# Patient Record
Sex: Male | Born: 1980 | Race: White | Hispanic: No | Marital: Single | Smoking: Former smoker
Health system: Southern US, Community
[De-identification: ages and names within clinical notes are randomized; demographics above are authoritative.]

---
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
## 2004-10-14 ENCOUNTER — Emergency Department (HOSPITAL_COMMUNITY): Admission: EM | Admit: 2004-10-14 | Discharge: 2004-10-14 | Payer: Self-pay | Admitting: Emergency Medicine

## 2005-03-27 IMAGING — CR DG HAND COMPLETE 3+V*R*
3 series · 3 of 3 positions shown · non-contrast
Comparison: none

CLINICAL DATA: Cut on glass table.  Laceration across posterior aspect metacarpals.  
RIGHT HAND ? THREE VIEWS 10/14/04:
No acute bony abnormality.  Extensive soft tissue laceration dorsally.  No definite evidence of a piece of glass in the soft tissues.  On the AP view there is a small, somewhat triangular shaped density which is faint and projecting just lateral to the mid shaft of the second metacarpal which potentially could represent a small sliver of glass however also the overlying bandage material may cause this appearance as well.

[view not recorded (1 of 3)]
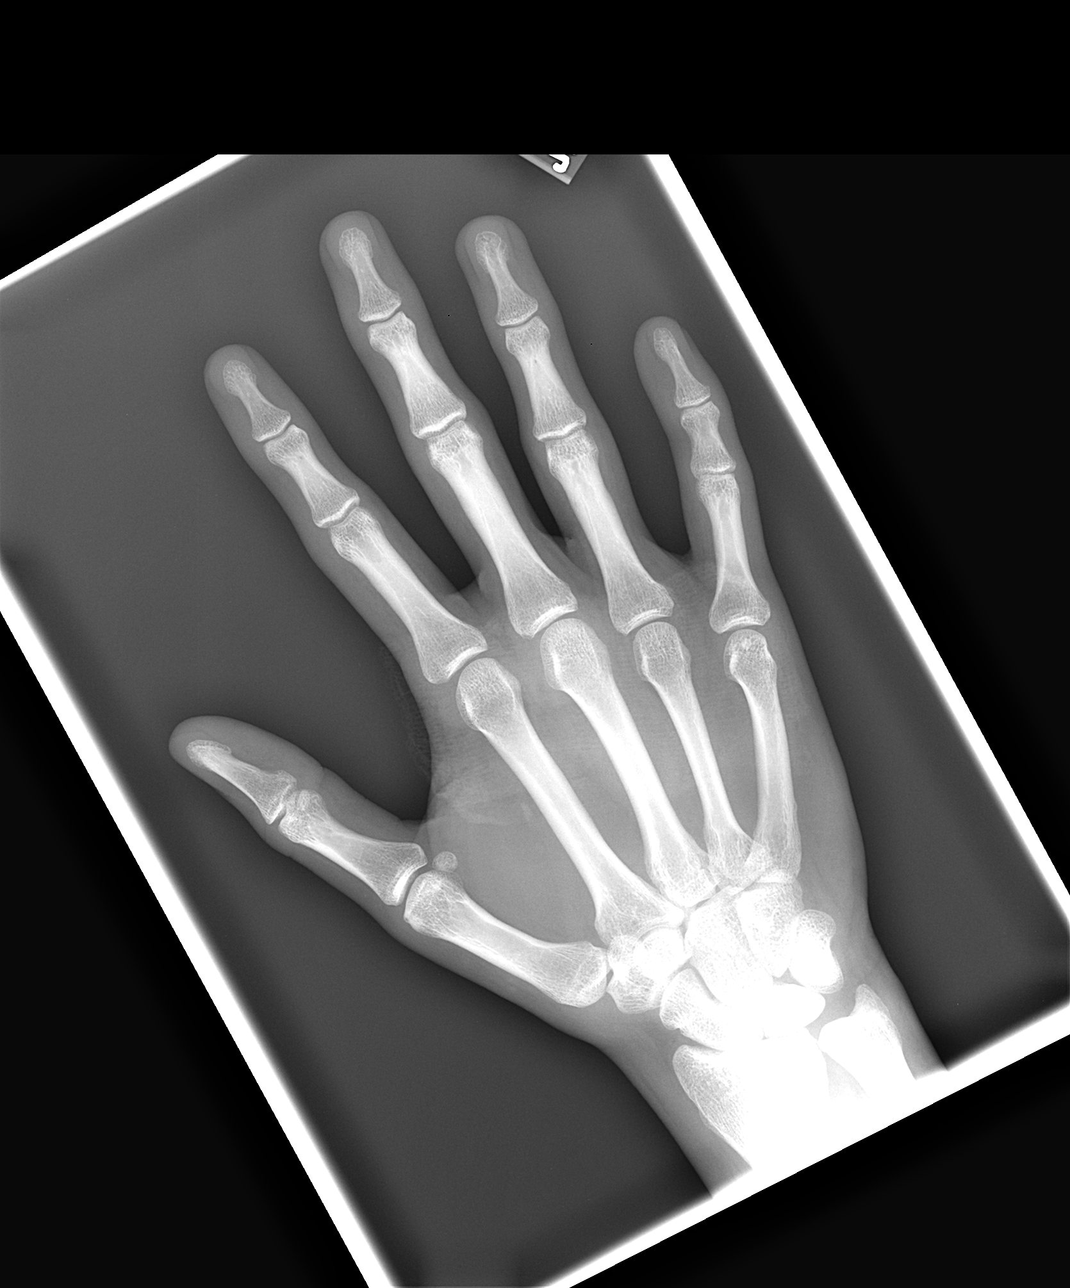

[view not recorded (2 of 3)]
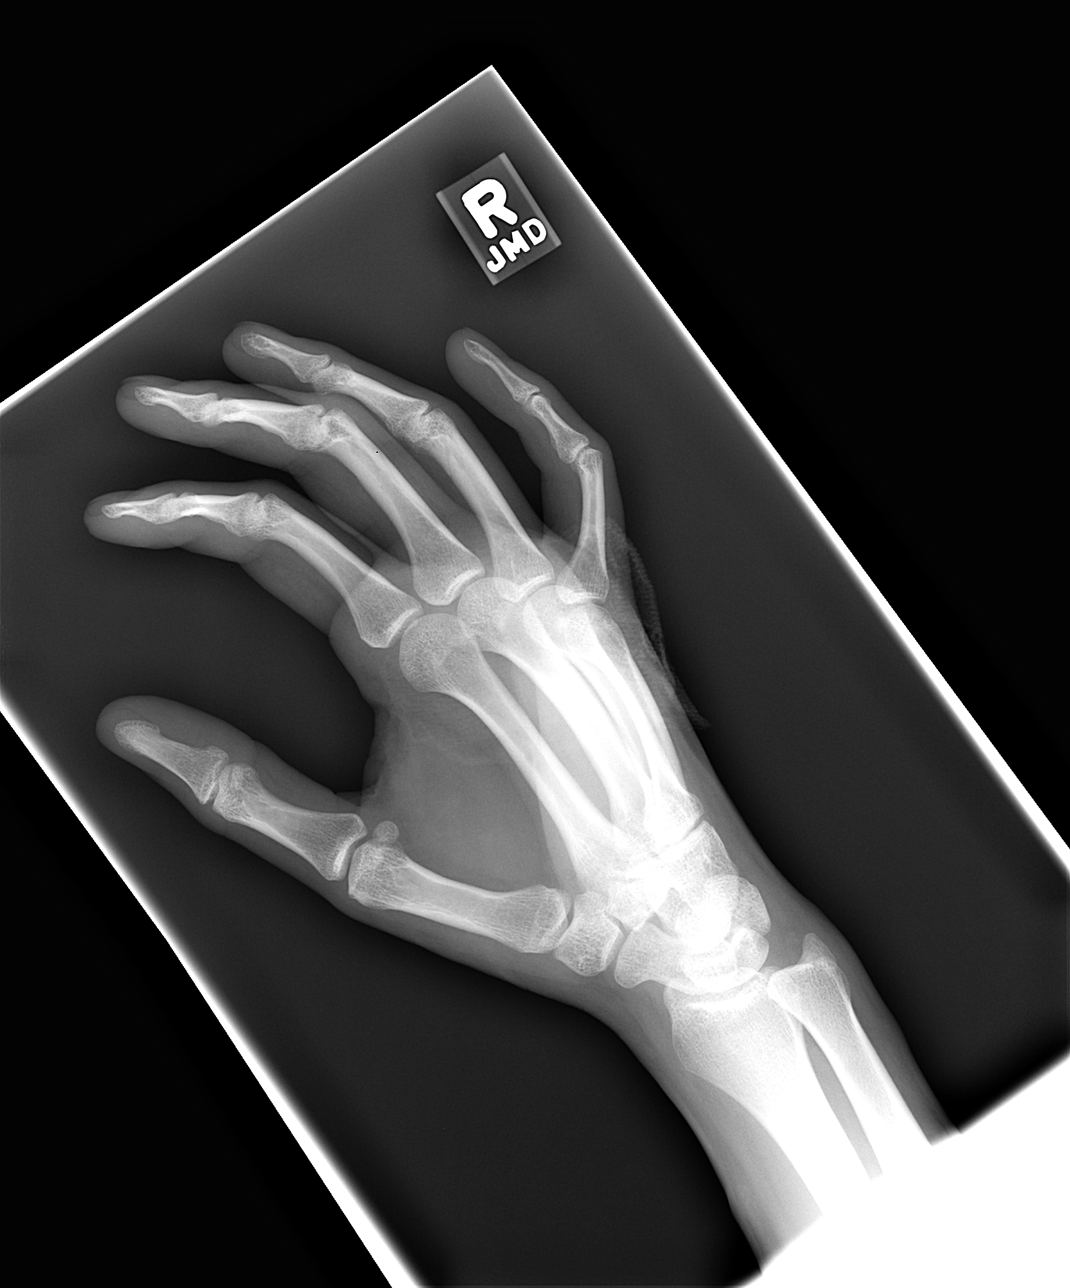

[view not recorded (3 of 3)]
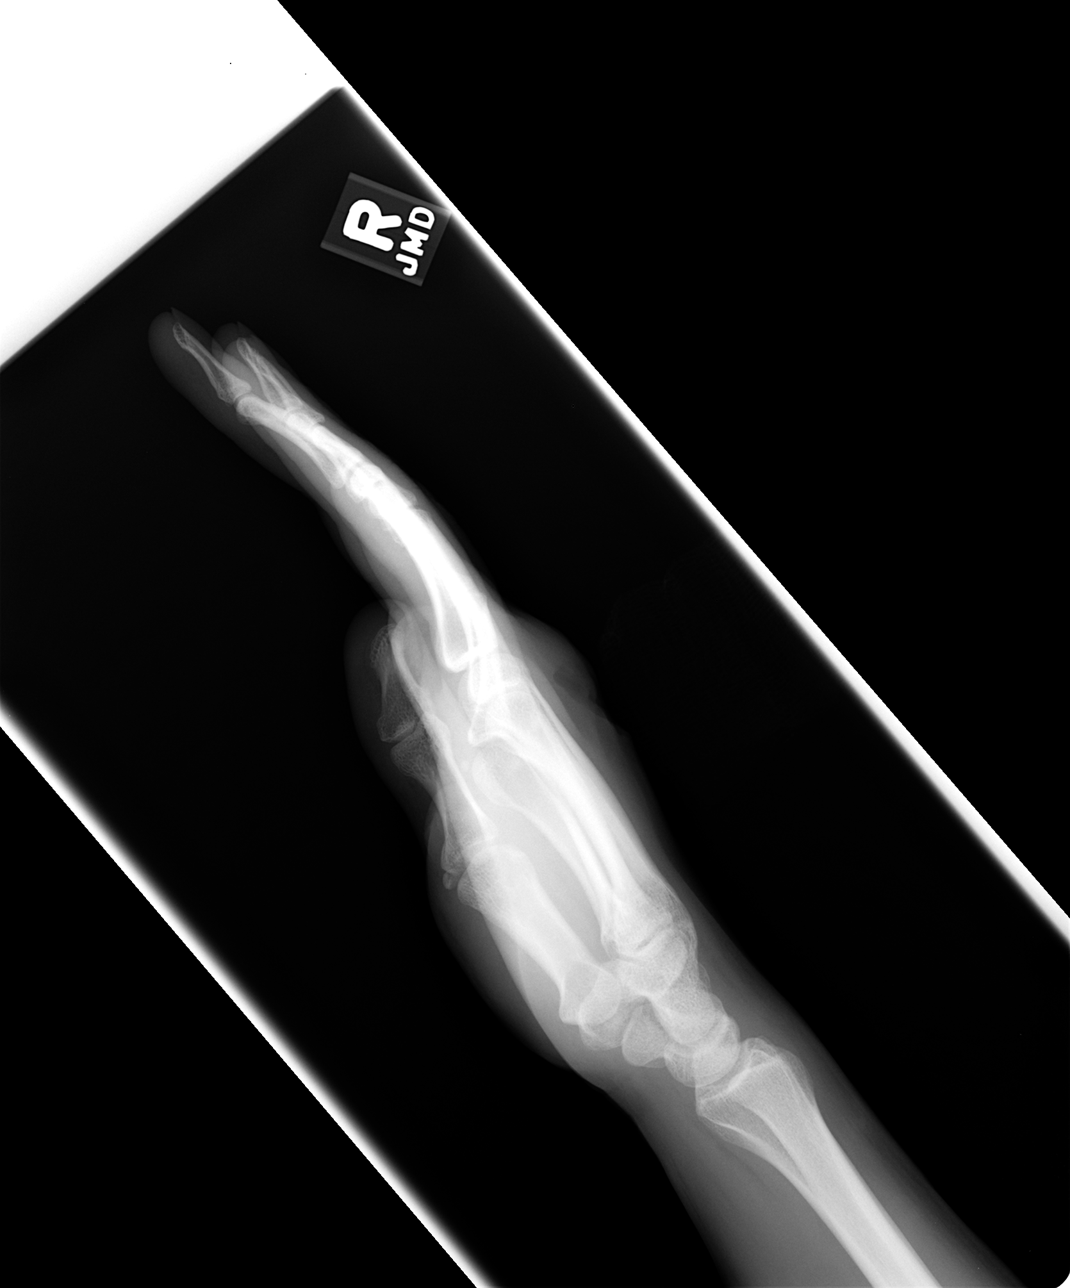

[3 of 3 positions shown; findings below may reference images not displayed]

IMPRESSION: No acute bony abnormality.  Extensive laceration.  No definite foreign body.

## 2010-11-06 ENCOUNTER — Emergency Department (HOSPITAL_COMMUNITY)
Admission: EM | Admit: 2010-11-06 | Discharge: 2010-11-06 | Payer: Self-pay | Source: Home / Self Care | Admitting: Emergency Medicine

## 2011-06-09 ENCOUNTER — Emergency Department (HOSPITAL_COMMUNITY)
Admission: EM | Admit: 2011-06-09 | Discharge: 2011-06-09 | Disposition: A | Discharge status: Home / Self Care | Payer: PRIVATE HEALTH INSURANCE | Attending: Emergency Medicine | Admitting: Emergency Medicine

## 2011-06-09 DIAGNOSIS — F411 Generalized anxiety disorder: Secondary | ICD-10-CM | POA: Insufficient documentation

## 2011-06-09 DIAGNOSIS — F1110 Opioid abuse, uncomplicated: Secondary | ICD-10-CM | POA: Insufficient documentation

## 2011-06-09 DIAGNOSIS — F1010 Alcohol abuse, uncomplicated: Secondary | ICD-10-CM | POA: Insufficient documentation

## 2011-06-09 DIAGNOSIS — R000 Tachycardia, unspecified: Secondary | ICD-10-CM | POA: Insufficient documentation

## 2011-06-09 LAB — URINALYSIS, ROUTINE W REFLEX MICROSCOPIC
Hgb urine dipstick: NEGATIVE
Protein, ur: 30 mg/dL — AB
Specific Gravity, Urine: 1.017 (ref 1.005–1.030)
Urobilinogen, UA: 0.2 mg/dL (ref 0.0–1.0)

## 2011-06-09 LAB — COMPREHENSIVE METABOLIC PANEL
ALT: 15 U/L (ref 0–53)
AST: 21 U/L (ref 0–37)
Alkaline Phosphatase: 86 U/L (ref 39–117)
BUN: 8 mg/dL (ref 6–23)
CO2: 25 mEq/L (ref 19–32)
Calcium: 8.6 mg/dL (ref 8.4–10.5)
Creatinine, Ser: 1.09 mg/dL (ref 0.50–1.35)
GFR calc Af Amer: >60 mL/min (ref >60)
GFR calc non Af Amer: >60 mL/min (ref >60)
Glucose, Bld: 86 mg/dL (ref 70–99)
Total Bilirubin: 0.1 mg/dL — ABNORMAL LOW (ref 0.3–1.2)

## 2011-06-09 LAB — RAPID URINE DRUG SCREEN, HOSP PERFORMED
Amphetamines: NOT DETECTED
Opiates: POSITIVE — AB
Tetrahydrocannabinol: NOT DETECTED

## 2011-06-09 LAB — CBC
HCT: 40.5 % (ref 39.0–52.0)
Hemoglobin: 13.5 g/dL (ref 13.0–17.0)
MCH: 30.3 pg (ref 26.0–34.0)
MCHC: 33.3 g/dL (ref 30.0–36.0)
MCV: 91 fL (ref 78.0–100.0)
Platelets: 261 10*3/uL (ref 150–400)
RBC: 4.45 MIL/uL (ref 4.22–5.81)
RDW: 13 % (ref 11.5–15.5)
WBC: 20.4 10*3/uL — ABNORMAL HIGH (ref 4.0–10.5)

## 2011-06-09 LAB — DIFFERENTIAL
Basophils Relative: 0 % (ref 0–1)
Eosinophils Absolute: 0 10*3/uL (ref 0.0–0.7)
Eosinophils Relative: 0 % (ref 0–5)
Lymphocytes Relative: 7 % — ABNORMAL LOW (ref 12–46)
Lymphs Abs: 1.3 10*3/uL (ref 0.7–4.0)
Monocytes Absolute: 1.2 10*3/uL — ABNORMAL HIGH (ref 0.1–1.0)
Monocytes Relative: 6 % (ref 3–12)
Neutro Abs: 17.8 10*3/uL — ABNORMAL HIGH (ref 1.7–7.7)

## 2011-06-09 LAB — URINE MICROSCOPIC-ADD ON

## 2011-06-09 LAB — ETHANOL: Alcohol, Ethyl (B): 149 mg/dL — ABNORMAL HIGH (ref 0–11)

## 2012-04-24 ENCOUNTER — Encounter: Payer: Self-pay | Admitting: Family

## 2012-04-24 ENCOUNTER — Ambulatory Visit: Payer: PRIVATE HEALTH INSURANCE | Admitting: Family

## 2012-04-24 ENCOUNTER — Ambulatory Visit (INDEPENDENT_AMBULATORY_CARE_PROVIDER_SITE_OTHER): Payer: PRIVATE HEALTH INSURANCE | Admitting: Family

## 2012-04-24 VITALS — BP 108/70 | Ht 74.0 in | Wt 202.0 lb

## 2012-04-24 DIAGNOSIS — L0100 Impetigo, unspecified: Secondary | ICD-10-CM

## 2012-04-24 DIAGNOSIS — K4090 Unilateral inguinal hernia, without obstruction or gangrene, not specified as recurrent: Secondary | ICD-10-CM

## 2012-04-24 LAB — POCT URINALYSIS DIPSTICK
Blood, UA: NEGATIVE
Ketones, UA: NEGATIVE
Protein, UA: NEGATIVE
Spec Grav, UA: 1.01
Urobilinogen, UA: 0.2
pH, UA: 6

## 2012-04-24 MED ORDER — HYDROCODONE-ACETAMINOPHEN 5-500 MG PO TABS: 1.0000 | ORAL_TABLET | Freq: Three times a day (TID) | ORAL | Status: AC | PRN

## 2012-04-24 MED ORDER — DOXYCYCLINE HYCLATE 100 MG PO TABS: 100.0000 mg | ORAL_TABLET | Freq: Two times a day (BID) | ORAL | Status: AC

## 2012-04-24 NOTE — Progress Notes (Signed)
  Subjective:    Patient ID: Colton Church, male    DOB: 10-18-1981, 31 y.o.   MRN: [id number]  HPI 31 year old is white male, nonsmoker, the patient to the practice is in today with complaints of pain in his groin x2 weeks. He reports heavy lifting at work lifting boxes align him on a daily basis. He is noticed a bulge in his right groin. The pain is particularly worse in the evenings after he is off work. First thing in the morning he does not noticed the bulge but he gets worse as the day progresses. He denies any urinary frequency, urgency, burning with urination, blood in his urine approximately urine. Denies any abdominal pain or back pain.  Patient also has concerns of a skin rash on his left lower chin that's been present x1 week. He reports pustular drainage from the area.  Review of Systems  Constitutional: Negative.   Respiratory: Negative.   Cardiovascular: Negative.   Genitourinary: Positive for testicular pain. Negative for frequency, hematuria, discharge and penile pain.  Musculoskeletal: Negative.   Skin: Negative.   Neurological: Negative.   Psychiatric/Behavioral: Negative.    No past medical history on file.  History   Social History  . Marital Status: Married    Spouse Name: N/A    Number of Children: N/A  . Years of Education: N/A   Occupational History  . Not on file.   Social History Main Topics  . Smoking status: Current Everyday Smoker  . Smokeless tobacco: Not on file  . Alcohol Use: No     recently quit  . Drug Use: No  . Sexually Active:    Other Topics Concern  . Not on file   Social History Narrative  . No narrative on file    No past surgical history on file.  Family History  Problem Relation Age of Onset  . Diabetes Mother     No Known Allergies  No current outpatient prescriptions on file prior to visit.    BP 108/70  Ht 6\' 2"  (1.88 m)  Wt 202 lb (91.627 kg)  BMI 25.94 kg/m2chart    Objective:   Physical Exam    Constitutional: He is oriented to person, place, and time. He appears well-developed and well-nourished.  Neck: Normal range of motion. Neck supple.  Cardiovascular: Normal rate, regular rhythm and normal heart sounds.   Pulmonary/Chest: Effort normal and breath sounds normal.  Abdominal: Soft. Bowel sounds are normal.  Genitourinary: Penis normal.  Musculoskeletal: Normal range of motion.  Neurological: He is alert and oriented to person, place, and time.  Skin: Skin is warm and dry.  Psychiatric: He has a normal mood and affect.          Assessment & Plan:  Assessment: Right inguinal hernia, impetigo  Plan: Refer patient to Gen. surgery to have hernia repair. Doxycycline 100 mg twice a day x7 days. Advised patient to go to the emergency department if signs or symptoms of strangulation occur. Call the office if symptoms worsen or persist. Recheck a schedule, when necessary.

## 2012-04-24 NOTE — Patient Instructions (Signed)
Inguinal Hernia, Adult  Muscles help keep everything in the body in its proper place. But if a weak spot in the muscles develops, something can poke through. That is called a hernia. When this happens in the lower part of the belly (abdomen), it is called an inguinal hernia. (It takes its name from a part of the body in this region called the inguinal canal.) A weak spot in the wall of muscles lets some fat or part of the small intestine bulge through. An inguinal hernia can develop at any age. Men get them more often than women.  CAUSES   In adults, an inguinal hernia develops over time.  · It can be triggered by:  · Suddenly straining the muscles of the lower abdomen.  · Lifting heavy objects.  · Straining to have a bowel movement. Difficult bowel movements (constipation) can lead to this.  · Constant coughing. This may be caused by smoking or lung disease.  · Being overweight.  · Being pregnant.  · Working at a job that requires long periods of standing or heavy lifting.  · Having had an inguinal hernia before.  One type can be an emergency situation. It is called a strangulated inguinal hernia. It develops if part of the small intestine slips through the weak spot and cannot get back into the abdomen. The blood supply can be cut off. If that happens, part of the intestine may die. This situation requires emergency surgery.  SYMPTOMS   Often, a small inguinal hernia has no symptoms. It is found when a healthcare provider does a physical exam. Larger hernias usually have symptoms.   · In adults, symptoms may include:  · A lump in the groin. This is easier to see when the person is standing. It might disappear when lying down.  · In men, a lump in the scrotum.  · Pain or burning in the groin. This occurs especially when lifting, straining or coughing.  · A dull ache or feeling of pressure in the groin.  · Signs of a strangulated hernia can include:  · A bulge in the groin that becomes very painful and tender to the  touch.  · A bulge that turns red or purple.  · Fever, nausea and vomiting.  · Inability to have a bowel movement or to pass gas.  DIAGNOSIS   To decide if you have an inguinal hernia, a healthcare provider will probably do a physical examination.  · This will include asking questions about any symptoms you have noticed.  · The healthcare provider might feel the groin area and ask you to cough. If an inguinal hernia is felt, the healthcare provider may try to slide it back into the abdomen.  · Usually no other tests are needed.  TREATMENT   Treatments can vary. The size of the hernia makes a difference. Options include:  · Watchful waiting. This is often suggested if the hernia is small and you have had no symptoms.  · No medical procedure will be done unless symptoms develop.  · You will need to watch closely for symptoms. If any occur, contact your healthcare provider right away.  · Surgery. This is used if the hernia is larger or you have symptoms.  · Open surgery. This is usually an outpatient procedure (you will not stay overnight in a hospital). An cut (incision) is made through the skin in the groin. The hernia is put back inside the abdomen. The weak area in the muscles is   then repaired by herniorrhaphy or hernioplasty. Herniorrhaphy: in this type of surgery, the weak muscles are sewn back together. Hernioplasty: a patch or mesh is used to close the weak area in the abdominal wall.  · Laparoscopy. In this procedure, a surgeon makes small incisions. A thin tube with a tiny video camera (called a laparoscope) is put into the abdomen. The surgeon repairs the hernia with mesh by looking with the video camera and using two long instruments.  HOME CARE INSTRUCTIONS   · After surgery to repair an inguinal hernia:  · You will need to take pain medicine prescribed by your healthcare provider. Follow all directions carefully.  · You will need to take care of the wound from the incision.  · Your activity will be  restricted for awhile. This will probably include no heavy lifting for several weeks. You also should not do anything too active for a few weeks. When you can return to work will depend on the type of job that you have.  · During "watchful waiting" periods, you should:  · Maintain a healthy weight.  · Eat a diet high in fiber (fruits, vegetables and whole grains).  · Drink plenty of fluids to avoid constipation. This means drinking enough water and other liquids to keep your urine clear or pale yellow.  · Do not lift heavy objects.  · Do not stand for long periods of time.  · Quit smoking. This should keep you from developing a frequent cough.  SEEK MEDICAL CARE IF:   · A bulge develops in your groin area.  · You feel pain, a burning sensation or pressure in the groin. This might be worse if you are lifting or straining.  · You develop a fever of more than 100.5° F (38.1° C).  SEEK IMMEDIATE MEDICAL CARE IF:   · Pain in the groin increases suddenly.  · A bulge in the groin gets bigger suddenly and does not go down.  · For men, there is sudden pain in the scrotum. Or, the size of the scrotum increases.  · A bulge in the groin area becomes red or purple and is painful to touch.  · You have nausea or vomiting that does not go away.  · You feel your heart beating much faster than normal.  · You cannot have a bowel movement or pass gas.  · You develop a fever of more than 102.0° F (38.9° C).  Document Released: 03/24/2009 Document Revised: 10/25/2011 Document Reviewed: 03/24/2009  [employer]® Patient Information ©2012 [employer].

## 2012-05-19 ENCOUNTER — Ambulatory Visit (INDEPENDENT_AMBULATORY_CARE_PROVIDER_SITE_OTHER): Payer: PRIVATE HEALTH INSURANCE | Admitting: Surgery

## 2012-05-19 ENCOUNTER — Encounter (INDEPENDENT_AMBULATORY_CARE_PROVIDER_SITE_OTHER): Payer: Self-pay | Admitting: Surgery

## 2012-05-19 VITALS — BP 124/80 | HR 76 | Temp 98.8°F | Resp 16 | Ht 74.0 in | Wt 196.6 lb

## 2012-05-19 DIAGNOSIS — K4090 Unilateral inguinal hernia, without obstruction or gangrene, not specified as recurrent: Secondary | ICD-10-CM | POA: Insufficient documentation

## 2012-05-19 NOTE — Progress Notes (Signed)
Subjective:     Patient ID: Colton Church, male   DOB: Mar 24, 1981, 31 y.o.   MRN: [id number]  HPI  Colton Church  September 22, 1981 [id number]  Patient Care Team: Baker Pierini, FNP as PCP - General (Family Medicine)  This patient is a 31 y.o.male who presents today for surgical evaluation at the request of Ms. Orvan Falconer.   Reason for visit: Right groin mass.  Probable hernia.  Patient is a pleasant young smoking male.  He noticed a painful lump in his right groin.  Concerning for hernia.  Evaluation was suspicious as well.  He's not had any nausea or vomiting.  Moving his bowels daily.  No fevers or chills.  Urinating fine.  Usually he can reduce it.  However more recently, it is out all the time.  Occasionally it is painful but not terribly so.  Affects his ability to work and concerns him.  No UTIs  There is no problem list on file for this patient.   History reviewed. No pertinent past medical history.  History reviewed. No pertinent past surgical history.  History   Social History  . Marital Status: Single    Spouse Name: N/A    Number of Children: N/A  . Years of Education: N/A   Occupational History  . Not on file.   Social History Main Topics  . Smoking status: Current Everyday Smoker -- 0.5 packs/day    Types: Cigarettes  . Smokeless tobacco: Not on file  . Alcohol Use: No     recently quit  . Drug Use: No  . Sexually Active:    Other Topics Concern  . Not on file   Social History Narrative  . No narrative on file    Family History  Problem Relation Age of Onset  . Diabetes Mother     No current outpatient prescriptions on file.     No Known Allergies  BP 124/80  Pulse 76  Temp 98.8 F (37.1 C) (Temporal)  Resp 16  Ht 6\' 2"  (1.88 m)  Wt 196 lb 9.6 oz (89.177 kg)  BMI 25.24 kg/m2  SpO2 98%  No results found.   Review of Systems  Constitutional: Negative for fever, chills and diaphoresis.  HENT: Negative for nosebleeds, sore throat,  facial swelling, mouth sores, trouble swallowing and ear discharge.   Eyes: Negative for photophobia, discharge and visual disturbance.  Respiratory: Negative for choking, chest tightness, shortness of breath and stridor.   Cardiovascular: Negative for chest pain and palpitations.  Gastrointestinal: Negative for nausea, vomiting, abdominal pain, diarrhea, constipation, blood in stool, abdominal distention, anal bleeding and rectal pain.  Genitourinary: Negative for dysuria, urgency, difficulty urinating and testicular pain.  Musculoskeletal: Negative for myalgias, back pain, arthralgias and gait problem.  Skin: Negative for color change, pallor, rash and wound.  Neurological: Negative for dizziness, speech difficulty, weakness, numbness and headaches.  Hematological: Negative for adenopathy. Does not bruise/bleed easily.  Psychiatric/Behavioral: Negative for hallucinations, confusion and agitation.       Objective:   Physical Exam  Constitutional: He is oriented to person, place, and time. He appears well-developed and well-nourished. No distress.  HENT:  Head: Normocephalic.  Mouth/Throat: Oropharynx is clear and moist. No oropharyngeal exudate.  Eyes: Conjunctivae and EOM are normal. Pupils are equal, round, and reactive to light. No scleral icterus.  Neck: Normal range of motion. Neck supple. No tracheal deviation present.  Cardiovascular: Normal rate, regular rhythm and intact distal pulses.   Pulmonary/Chest: Effort normal and  breath sounds normal. No respiratory distress.  Abdominal: Soft. He exhibits no distension. There is no tenderness. A hernia is present. Hernia confirmed positive in the right inguinal area. Hernia confirmed negative in the left inguinal area.  Genitourinary: Testes normal.    Right testis shows no mass and no tenderness. Left testis shows no mass and no tenderness. Circumcised.  Musculoskeletal: Normal range of motion. He exhibits no tenderness.    Lymphadenopathy:    He has no cervical adenopathy.       Right: No inguinal adenopathy present.       Left: No inguinal adenopathy present.  Neurological: He is alert and oriented to person, place, and time. No cranial nerve deficit. He exhibits normal muscle tone. Coordination normal.  Skin: Skin is warm and dry. No rash noted. He is not diaphoretic. No erythema. No pallor.  Psychiatric: He has a normal mood and affect. His behavior is normal. Judgment and thought content normal.       Assessment:     RIH     Plan:     Lap repair:  The anatomy & physiology of the abdominal wall and pelvic floor was discussed.  The pathophysiology of hernias in the inguinal and pelvic region was discussed.  Natural history risks such as progressive enlargement, pain, incarceration & strangulation was discussed.   Contributors to complications such as smoking, obesity, diabetes, prior surgery, etc were discussed.    I feel the risks of no intervention will lead to serious problems that outweigh the operative risks; therefore, I recommended surgery to reduce and repair the hernia.  I explained laparoscopic techniques with possible need for an open approach.  I noted usual use of mesh to patch and/or buttress hernia repair  Risks such as bleeding, infection, abscess, need for further treatment, heart attack, death, and other risks were discussed.  I noted a good likelihood this will help address the problem.   Goals of post-operative recovery were discussed as well.  Possibility that this will not correct all symptoms was explained.  I stressed the importance of low-impact activity, aggressive pain control, avoiding constipation, & not pushing through pain to minimize risk of post-operative chronic pain or injury. Possibility of reherniation was discussed.  We will work to minimize complications.     An educational handout further explaining the pathology & treatment options was given as well.  Questions were  answered.  The patient expresses understanding & wishes to proceed with surgery.

## 2012-05-19 NOTE — Patient Instructions (Addendum)
See the Handout(s) we gave you.  Consider surgery.  Please call our office at [phone] if you wish to schedule surgery or if you have further questions / concerns.    Hernia A hernia occurs when an internal organ pushes out through a weak spot in the abdominal wall. Hernias most commonly occur in the groin and around the navel. Hernias often can be pushed back into place (reduced). Most hernias tend to get worse over time. Some abdominal hernias can get stuck in the opening (irreducible or incarcerated hernia) and cannot be reduced. An irreducible abdominal hernia which is tightly squeezed into the opening is at risk for impaired blood supply (strangulated hernia). A strangulated hernia is a medical emergency. Because of the risk for an irreducible or strangulated hernia, surgery may be recommended to repair a hernia. CAUSES   Heavy lifting.   Prolonged coughing.   Straining to have a bowel movement.   A cut (incision) made during an abdominal surgery.  HOME CARE INSTRUCTIONS   Bed rest is not required. You may continue your normal activities.   Avoid lifting more than 10 pounds (4.5 kg) or straining.   Cough gently. If you are a smoker it is best to stop. Even the best hernia repair can break down with the continual strain of coughing. Even if you do not have your hernia repaired, a cough will continue to aggravate the problem.   Do not wear anything tight over your hernia. Do not try to keep it in with an outside bandage or truss. These can damage abdominal contents if they are trapped within the hernia sac.   Eat a normal diet.   Avoid constipation. Straining over long periods of time will increase hernia size and encourage breakdown of repairs. If you cannot do this with diet alone, stool softeners may be used.  SEEK IMMEDIATE MEDICAL CARE IF:   You have a fever.   You develop increasing abdominal pain.   You feel nauseous or vomit.   Your hernia is stuck outside the  abdomen, looks discolored, feels hard, or is tender.   You have any changes in your bowel habits or in the hernia that are unusual for you.   You have increased pain or swelling around the hernia.   You cannot push the hernia back in place by applying gentle pressure while lying down.  MAKE SURE YOU:   Understand these instructions.   Will watch your condition.   Will get help right away if you are not doing well or get worse.  Document Released: 11/05/2005 Document Revised: 10/25/2011 Document Reviewed: 06/24/2008 [employer] Patient Information 2012 [employer]. 

## 2012-06-12 DIAGNOSIS — K4030 Unilateral inguinal hernia, with obstruction, without gangrene, not specified as recurrent: Secondary | ICD-10-CM

## 2012-06-12 HISTORY — PX: HERNIA REPAIR: SHX51

## 2012-06-25 ENCOUNTER — Encounter (INDEPENDENT_AMBULATORY_CARE_PROVIDER_SITE_OTHER): Payer: PRIVATE HEALTH INSURANCE | Admitting: Surgery

## 2012-06-30 ENCOUNTER — Encounter (INDEPENDENT_AMBULATORY_CARE_PROVIDER_SITE_OTHER): Payer: Self-pay | Admitting: Surgery

## 2012-06-30 ENCOUNTER — Ambulatory Visit (INDEPENDENT_AMBULATORY_CARE_PROVIDER_SITE_OTHER): Payer: PRIVATE HEALTH INSURANCE | Admitting: Surgery

## 2012-06-30 VITALS — BP 116/80 | HR 92 | Temp 98.8°F | Ht 74.0 in | Wt 194.8 lb

## 2012-06-30 DIAGNOSIS — K4090 Unilateral inguinal hernia, without obstruction or gangrene, not specified as recurrent: Secondary | ICD-10-CM

## 2012-06-30 DIAGNOSIS — F17200 Nicotine dependence, unspecified, uncomplicated: Secondary | ICD-10-CM

## 2012-06-30 DIAGNOSIS — Z716 Tobacco abuse counseling: Secondary | ICD-10-CM | POA: Insufficient documentation

## 2012-06-30 DIAGNOSIS — Z7189 Other specified counseling: Secondary | ICD-10-CM

## 2012-06-30 NOTE — Patient Instructions (Addendum)
Managing Pain  Pain after surgery or related to activity is often due to strain/injury to muscle, tendon, nerves and/or incisions.  This pain is usually short-term and will improve in a few months.   Many people find it helpful to do the following things TOGETHER to help speed the process of healing and to get back to regular activity more quickly:  1. Avoid heavy physical activity a.  no lifting greater than 20 pounds b. Do not "push through" the pain.  Listen to your body and avoid positions and maneuvers than reproduce the pain c. Walking is okay as tolerated, but go slowly and stop when getting sore.  d. Remember: If it hurts to do it, then don't do it! 2. Take Anti-inflammatory medication  a. Take with food/snack around the clock for 1-2 weeks i. This helps the muscle and nerve tissues become less irritable and calm down faster b. Choose ONE of the following over-the-counter medications: i. Naproxen 220mg  tabs (ex. Aleve) 1-2 pills twice a day  ii. Ibuprofen 200mg  tabs (ex. Advil, Motrin) 3-4 pills with every meal and just before bedtime iii. Acetaminophen 500mg  tabs (Tylenol) 1-2 pills with every meal and just before bedtime 3. Use a Heating pad or Ice/Cold Pack a. 4-6 times a day b. May use warm bath/hottub  or showers 4. Try Gentle Massage and/or Stretching  a. at the area of pain many times a day b. stop if you feel pain - do not overdo it  Try these steps together to help you body heal faster and avoid making things get worse.  Doing just one of these things may not be enough.    If you are not getting better after two weeks or are noticing you are getting worse, contact our office for further advice; we may need to re-evaluate you & see what other things we can do to help.  Smoking Cessation, Tips for Success YOU CAN QUIT SMOKING If you are ready to quit smoking, congratulations! You have chosen to help yourself be healthier. Cigarettes bring nicotine, tar, carbon monoxide,  and other irritants into your body. Your lungs, heart, and blood vessels will be able to work better without these poisons. There are many different ways to quit smoking. Nicotine gum, nicotine patches, a nicotine inhaler, or nicotine nasal spray can help with physical craving. Hypnosis, support groups, and medicines help break the habit of smoking. Here are some tips to help you quit for good.  Throw away all cigarettes.   Clean and remove all ashtrays from your home, work, and car.   On a card, write down your reasons for quitting. Carry the card with you and read it when you get the urge to smoke.   Cleanse your body of nicotine. Drink enough water and fluids to keep your urine clear or pale yellow. Do this after quitting to flush the nicotine from your body.   Learn to predict your moods. Do not let a bad situation be your excuse to have a cigarette. Some situations in your life might tempt you into wanting a cigarette.   Never have "just one" cigarette. It leads to wanting another and another. Remind yourself of your decision to quit.   Change habits associated with smoking. If you smoked while driving or when feeling stressed, try other activities to replace smoking. Stand up when drinking your coffee. Brush your teeth after eating. Sit in a different chair when you read the paper. Avoid alcohol while trying to quit, and  try to drink fewer caffeinated beverages. Alcohol and caffeine may urge you to smoke.   Avoid foods and drinks that can trigger a desire to smoke, such as sugary or spicy foods and alcohol.   Ask people who smoke not to smoke around you.   Have something planned to do right after eating or having a cup of coffee. Take a walk or exercise to perk you up. This will help to keep you from overeating.   Try a relaxation exercise to calm you down and decrease your stress. Remember, you may be tense and nervous for the first 2 weeks after you quit, but this will pass.   Find  new activities to keep your hands busy. Play with a pen, coin, or rubber band. Doodle or draw things on paper.   Brush your teeth right after eating. This will help cut down on the craving for the taste of tobacco after meals. You can try mouthwash, too.   Use oral substitutes, such as lemon drops, carrots, a cinnamon stick, or chewing gum, in place of cigarettes. Keep them handy so they are available when you have the urge to smoke.   When you have the urge to smoke, try deep breathing.   Designate your home as a nonsmoking area.   If you are a heavy smoker, ask your caregiver about a prescription for nicotine chewing gum. It can ease your withdrawal from nicotine.   Reward yourself. Set aside the cigarette money you save and buy yourself something nice.   Look for support from others. Join a support group or smoking cessation program. Ask someone at home or at work to help you with your plan to quit smoking.   Always ask yourself, "Do I need this cigarette or is this just a reflex?" Tell yourself, "Today, I choose not to smoke," or "I do not want to smoke." You are reminding yourself of your decision to quit, even if you do smoke a cigarette.  HOW WILL I FEEL WHEN I QUIT SMOKING?  The benefits of not smoking start within days of quitting.   You may have symptoms of withdrawal because your body is used to nicotine (the addictive substance in cigarettes). You may crave cigarettes, be irritable, feel very hungry, cough often, get headaches, or have difficulty concentrating.   The withdrawal symptoms are only temporary. They are strongest when you first quit but will go away within 10 to 14 days.   When withdrawal symptoms occur, stay in control. Think about your reasons for quitting. Remind yourself that these are signs that your body is healing and getting used to being without cigarettes.   Remember that withdrawal symptoms are easier to treat than the major diseases that smoking can  cause.   Even after the withdrawal is over, expect periodic urges to smoke. However, these cravings are generally short-lived and will go away whether you smoke or not. Do not smoke!   If you relapse and smoke again, do not lose hope. Most smokers quit 3 times before they are successful.   If you relapse, do not give up! Plan ahead and think about what you will do the next time you get the urge to smoke.  LIFE AS A NONSMOKER: MAKE IT FOR A MONTH, MAKE IT FOR LIFE Day 1: Hang this page where you will see it every day. Day 2: Get rid of all ashtrays, matches, and lighters. Day 3: Drink water. Breathe deeply between sips. Day 4: Avoid places with   smoke-filled air, such as bars, clubs, or the smoking section of restaurants. Day 5: Keep track of how much money you save by not smoking. Day 6: Avoid boredom. Keep a good book with you or go to the movies. Day 7: Reward yourself! One week without smoking! Day 8: Make a dental appointment to get your teeth cleaned. Day 9: Decide how you will turn down a cigarette before it is offered to you. Day 10: Review your reasons for quitting. Day 11: Distract yourself. Stay active to keep your mind off smoking and to relieve tension. Take a walk, exercise, read a book, do a crossword puzzle, or try a new hobby. Day 12: Exercise. Get off the bus before your stop or use stairs instead of escalators. Day 13: Call on friends for support and encouragement. Day 14: Reward yourself! Two weeks without smoking! Day 15: Practice deep breathing exercises. Day 16: Bet a friend that you can stay a nonsmoker. Day 17: Ask to sit in nonsmoking sections of restaurants. Day 18: Hang up "No Smoking" signs. Day 19: Think of yourself as a nonsmoker. Day 20: Each morning, tell yourself you will not smoke. Day 21: Reward yourself! Three weeks without smoking! Day 22: Think of smoking in negative ways. Remember how it stains your teeth, gives you bad breath, and leaves you short  of breath. Day 23: Eat a nutritious breakfast. Day 24:Do not relive your days as a smoker. Day 25: Hold a pencil in your hand when talking on the telephone. Day 26: Tell all your friends you do not smoke. Day 27: Think about how much better food tastes. Day 28: Remember, one cigarette is one too many. Day 29: Take up a hobby that will keep your hands busy. Day 30: Congratulations! One month without smoking! Give yourself a big reward. Your caregiver can direct you to community resources or hospitals for support, which may include:  Group support.   Education.   Hypnosis.   Subliminal therapy.  Document Released: 08/03/2004 Document Revised: 10/25/2011 Document Reviewed: 08/22/2009 [facility] Patient Information 2012 [place], [place].

## 2012-06-30 NOTE — Progress Notes (Signed)
Subjective:     Patient ID: Colton Church, male   DOB: [birth year], 31 y.o.   MRN: [id number]  HPI  Colton Church  Dec 10, 1980 [id number]  Patient Care Team: Baker Pierini, FNP as PCP - General (Family Medicine)  This patient is a 31 y.o.male who presents today for surgical evaluation  Procedure laparoscopic right inguinal hernia repair with mesh 06/12/2012  Patient comes in today feeling well.  Weaned off pain medicines.  Getting more active.  Still some soreness but much more bearable.  No murmur bruising.  Open to get back to work soon.  Must be completely unrestricted however.  Patient Active Problem List  Diagnosis  . Right inguinal hernia  . Tobacco abuse     History reviewed. No pertinent past medical history.  Past Surgical History  Procedure Date  . Hernia repair 06/12/12    Right inguinal hernia    History   Social History  . Marital Status: Single    Spouse Name: N/A    Number of Children: N/A  . Years of Education: N/A   Occupational History  . Not on file.   Social History Main Topics  . Smoking status: Current Everyday Smoker -- 0.5 packs/day    Types: Cigarettes  . Smokeless tobacco: Not on file  . Alcohol Use: No     recently quit  . Drug Use: No  . Sexually Active:    Other Topics Concern  . Not on file   Social History Narrative  . No narrative on file    Family History  Problem Relation Age of Onset  . Diabetes Mother     No current outpatient prescriptions on file.     No Known Allergies  BP 116/80  Pulse 92  Temp 98.8 F (37.1 C) (Temporal)  Ht 6\' 2"  (1.88 m)  Wt 194 lb 12.8 oz (88.361 kg)  BMI 25.01 kg/m2  SpO2 98%  No results found.   Review of Systems  Constitutional: Negative for fever, chills and diaphoresis.  HENT: Negative for sore throat, trouble swallowing and neck pain.   Eyes: Negative for photophobia and visual disturbance.  Respiratory: Negative for choking and shortness of breath.     Cardiovascular: Negative for chest pain and palpitations.  Gastrointestinal: Negative for nausea, vomiting, abdominal distention, anal bleeding and rectal pain.  Genitourinary: Negative for dysuria, urgency, difficulty urinating and testicular pain.  Musculoskeletal: Negative for myalgias, arthralgias and gait problem.  Skin: Negative for color change and rash.  Neurological: Negative for dizziness, speech difficulty, weakness and numbness.  Hematological: Negative for adenopathy.  Psychiatric/Behavioral: Negative for hallucinations, confusion and agitation.       Objective:   Physical Exam  Constitutional: He is oriented to person, place, and time. He appears well-developed and well-nourished. No distress.  HENT:  Head: Normocephalic.  Mouth/Throat: Oropharynx is clear and moist. No oropharyngeal exudate.  Eyes: Conjunctivae and EOM are normal. Pupils are equal, round, and reactive to light. No scleral icterus.  Neck: Normal range of motion. No tracheal deviation present.  Cardiovascular: Normal rate, normal heart sounds and intact distal pulses.   Pulmonary/Chest: Effort normal. No respiratory distress.  Abdominal: Soft. He exhibits no distension. There is no tenderness. Hernia confirmed negative in the right inguinal area and confirmed negative in the left inguinal area.       Incisions clean with normal healing ridges.  No hernias.  Mild right groin swelling/soreness  Musculoskeletal: Normal range of motion. He exhibits no tenderness.  Neurological: He is alert and oriented to person, place, and time. No cranial nerve deficit. He exhibits normal muscle tone. Coordination normal.  Skin: Skin is warm and dry. No rash noted. He is not diaphoretic.  Psychiatric: He has a normal mood and affect. His behavior is normal.       Assessment:     2.5 weeks s/p lap RIH repair, recovering    Plan:     Increase activity as tolerated.  Do not push through pain.  OK to RTW in ~ 2weeks  unrestricted  Advanced on diet as tolerated. Bowel regimen to avoid problems.  Return to clinic p.r.n. The patient expressed understanding and appreciation

## 2013-02-11 ENCOUNTER — Ambulatory Visit (INDEPENDENT_AMBULATORY_CARE_PROVIDER_SITE_OTHER): Payer: PRIVATE HEALTH INSURANCE | Admitting: Family Medicine

## 2013-02-11 VITALS — BP 116/80 | HR 70 | Temp 98.3°F | Resp 18 | Ht 74.5 in | Wt 202.0 lb

## 2013-02-11 DIAGNOSIS — S3994XA Unspecified injury of external genitals, initial encounter: Secondary | ICD-10-CM

## 2013-02-11 NOTE — Progress Notes (Signed)
  Subjective:    Patient ID: Colton Church, male    DOB: [birth year], 32 y.o.   MRN: [id number]  HPI Patient is a 32 year old male coming in with a two-day history of swelling of the penis. Patient was having sexual intercourse, he is in a monogamous relationship, and after having 6 he did notice some swelling of the dorsal aspect of his penis. Patient denies any discoloration or any significant pain. Patient states that over the last 2 days it has not seemed it worse but it is only slowly getting better. He patient has been urinating without any trouble. Patient denies any redness or inflammation states that he can palpate something on the top of the dorsum of the penis this seems to be hard. Patient is of course very concerned. Patient has never had a history of this previously. Denies fever, chills, dysuria, abdominal pain, or testicular pain.   Review of Systems     Objective:   Physical Exam Blood pressure 116/80, pulse 70, temperature 98.3 F (36.8 C), temperature source Oral, resp. rate 18, height 6' 2.5" (1.892 m), weight 202 lb (91.627 kg), SpO2 98.00%. General: No apparent distress alert oriented x3 mood and affect normal. Cardiovascular: Regular rate and rhythm no murmur Pulmonary: Clear to auscultation bilaterally Abdominal exam: Thousand positive nontender nondistended Genitalia exam: Testes are descended bilaterally there is no swelling and is nontender. Previous reflexes intact. Penis does have some mild dorsal swelling that is hard in consistency like a resolving hematoma right at the base of the glans of the penis but in the soft tissue layer. This does extend toward the abdomen and can be palpable superficial. This does not appear to be tender.  Assessment: Dorsal testicular swelling after likely injury questionable for acute soft tissue injury, dorsal vein injury, or early Perrone's disease.  Plan: At this time patient is being relatively comfortable and does not appear to  be worsening. Patient knows of red flags and was given a handout. We will refer to urology for further evaluation but likely this will be treated with conservative. Patient would like to hear from a specialist which I do understand.       Assessment & Plan:

## 2013-02-11 NOTE — Patient Instructions (Signed)
Very nice to meet you I thin you may have a injury to your penis but no scary findings.  We will send you to urology and they should be calling you in the next day or two.  If the pain gets much worse or start having worsening swelling or discoloration than seek medical attention immediately.

## 2020-02-24 ENCOUNTER — Ambulatory Visit
Admission: EM | Admit: 2020-02-24 | Discharge: 2020-02-24 | Disposition: A | Discharge status: Home / Self Care | Payer: Commercial Managed Care - PPO | Attending: Physician Assistant | Admitting: Physician Assistant

## 2020-02-24 DIAGNOSIS — R1013 Epigastric pain: Secondary | ICD-10-CM | POA: Diagnosis not present

## 2020-02-24 DIAGNOSIS — R1033 Periumbilical pain: Secondary | ICD-10-CM | POA: Diagnosis not present

## 2020-02-24 MED ORDER — ONDANSETRON 4 MG PO TBDP: 4.0000 mg | ORAL_TABLET | Freq: Three times a day (TID) | ORAL | 0 refills | Status: AC | PRN

## 2020-02-24 MED ORDER — FAMOTIDINE 20 MG PO TABS: 20.0000 mg | ORAL_TABLET | Freq: Two times a day (BID) | ORAL | 0 refills | Status: AC

## 2020-02-24 NOTE — ED Provider Notes (Signed)
[facility]    CSN: [id number] Arrival date & time: 02/24/20  1538      History   Chief Complaint Chief Complaint  Patient presents with  . Abdominal Pain    HPI COLSEN MODI is a 39 y.o. male.   39 year old male comes in for acute onset of periumbilica/epigastic pain last night.had sharp pain with mild nausea. Pain is now burning/dull and improved. Nausea resolved. Denies diarrhea, vomiting. Denies fever, chills, body aches. Denies history of GERD. Denies URI symptoms. No chronic NSAIDs.  Former smoker. Occasional EtOH use, energy drink. Due to these symptoms, missed work and requires note.      History reviewed. No pertinent past medical history.  Patient Active Problem List   Diagnosis Date Noted  . Tobacco abuse  06/30/2012  . Right inguinal hernia 05/19/2012    Past Surgical History:  Procedure Laterality Date  . HERNIA REPAIR  06/12/12   Right inguinal hernia       Home Medications    Prior to Admission medications   Medication Sig Start Date End Date Taking? Authorizing Provider  famotidine (PEPCID) 20 MG tablet Take 1 tablet (20 mg total) by mouth 2 (two) times daily. 02/24/20   Cathie Hoops,  V, PA-C  ondansetron (ZOFRAN ODT) 4 MG disintegrating tablet Take 1 tablet (4 mg total) by mouth every 8 (eight) hours as needed for nausea or vomiting. 02/24/20   Belinda Fisher, PA-C    Family History Family History  Problem Relation Age of Onset  . Diabetes Mother     Social History Social History   Tobacco Use  . Smoking status: Former Smoker    Packs/day: 0.50    Types: Cigarettes  . Smokeless tobacco: Never Used  Substance Use Topics  . Alcohol use: Yes    Comment: social  . Drug use: No     Allergies   Patient has no known allergies.   Review of Systems Review of Systems  Reason unable to perform ROS: See HPI as above.     Physical Exam Triage Vital Signs ED Triage Vitals  Enc Vitals Group     BP 02/24/20 1657 132/71     Pulse  Rate 02/24/20 1657 78     Resp 02/24/20 1657 18     Temp 02/24/20 1657 98.2 F (36.8 C)     Temp Source 02/24/20 1657 Oral     SpO2 02/24/20 1657 95 %     Weight --      Height --      Head Circumference --      Peak Flow --      Pain Score 02/24/20 1713 3     Pain Loc --      Pain Edu? --      Excl. in GC? --    No data found.  Updated Vital Signs BP 132/71 (BP Location: Left Arm)   Pulse 78   Temp 98.2 F (36.8 C) (Oral)   Resp 18   SpO2 95%   Visual Acuity Right Eye Distance:   Left Eye Distance:   Bilateral Distance:    Right Eye Near:   Left Eye Near:    Bilateral Near:     Physical Exam Constitutional:      General: He is not in acute distress.    Appearance: Normal appearance. He is not ill-appearing, toxic-appearing or diaphoretic.  HENT:     Head: Normocephalic and atraumatic.  Cardiovascular:  Rate and Rhythm: Normal rate and regular rhythm.  Pulmonary:     Effort: Pulmonary effort is normal. No respiratory distress.     Comments: LCTAB Abdominal:     General: Bowel sounds are normal.     Palpations: Abdomen is soft.     Tenderness: There is no abdominal tenderness. There is no right CVA tenderness, left CVA tenderness, guarding or rebound.  Musculoskeletal:     Cervical back: Normal range of motion and neck supple.  Skin:    General: Skin is warm and dry.  Neurological:     Mental Status: He is alert and oriented to person, place, and time.      UC Treatments / Results  Labs (all labs ordered are listed, but only abnormal results are displayed) Labs Reviewed - No data to display  EKG   Radiology No results found.  Procedures Procedures (including critical care time)  Medications Ordered in UC Medications - No data to display  Initial Impression / Assessment and Plan / UC Course  I have reviewed the triage vital signs and the nursing notes.  Pertinent labs & imaging results that were available during my care of the patient  were reviewed by me and considered in my medical decision making (see chart for details).    Abdomen soft, nontender. Symptoms improved since onset. Will provide symptomatic treatment as needed. Bland diet, advance as tolerated. Return precautions given.   Final Clinical Impressions(s) / UC Diagnoses   Final diagnoses:  Abdominal pain, epigastric  Periumbilical pain   ED Prescriptions    Medication Sig Dispense Auth. Provider   famotidine (PEPCID) 20 MG tablet Take 1 tablet (20 mg total) by mouth 2 (two) times daily. 30 tablet ,  V, PA-C   ondansetron (ZOFRAN ODT) 4 MG disintegrating tablet Take 1 tablet (4 mg total) by mouth every 8 (eight) hours as needed for nausea or vomiting. 15 tablet Ok Edwards, PA-C     PDMP not reviewed this encounter.   Ok Edwards, PA-C 02/24/20 2212

## 2020-02-24 NOTE — ED Triage Notes (Signed)
Pt states woke up during the night with center abdominal pain with nausea and unable to work today. States needs a work note. States the pain has subsided from a sharp pain to a dull burn at times since he ate today. Denies n/v/d at this time. Pt states he thinks its was from what he ate last night.

## 2020-02-24 NOTE — Discharge Instructions (Addendum)
No alarming signs on exam. Avoid spicy food, alcohol, caffeine at this time. Start pepcid, zofran as needed. Keep hydrated, urine should be clear to pale yellow in color. Bland diet, advance as tolerated. Monitor for any worsening of symptoms, nausea or vomiting not controlled by medication, worsening abdominal pain, fever, go to the emergency department for further evaluation needed.

## 2023-07-08 ENCOUNTER — Ambulatory Visit
Admission: EM | Admit: 2023-07-08 | Discharge: 2023-07-08 | Disposition: A | Discharge status: Home / Self Care | Payer: Managed Care, Other (non HMO) | Attending: Internal Medicine | Admitting: Internal Medicine

## 2023-07-08 DIAGNOSIS — H6121 Impacted cerumen, right ear: Secondary | ICD-10-CM

## 2023-07-08 DIAGNOSIS — H60501 Unspecified acute noninfective otitis externa, right ear: Secondary | ICD-10-CM

## 2023-07-08 MED ORDER — OFLOXACIN 0.3 % OT SOLN: 10.0000 [drp] | Freq: Every day | OTIC | 0 refills | Status: AC

## 2023-07-08 NOTE — ED Triage Notes (Signed)
Pt presents with c/o possible ear infection. States his ear starting hurting after being in the pool last week.   Pt states his rt ear hurts the most.

## 2023-07-08 NOTE — Discharge Instructions (Signed)
Start ofloxacin antibiotic eardrops as prescribed.  Keep water out of the ear until you are done with treatment.  Do not attempt any more additional earwax irrigations at home.  Once you complete treatment you may return to clinic for evaluation of further earwax removal if indicated.  Please follow-up with your PCP in 2 days for recheck.  Please go to the ER for any worsening symptoms.  I hope you feel better soon!

## 2023-07-08 NOTE — ED Provider Notes (Signed)
[facility]    CSN: [id number] Arrival date & time: 07/08/23  1612      History   Chief Complaint Chief Complaint  Patient presents with   Ear Pain    HPI Colton Church is a 42 y.o. male presents for evaluation of ear pain.  Patient reports 1 week of right ear pain that began after he went swimming.  Reports muffled hearing but denies any drainage, fevers, chills, URI symptoms.  He did try home earwax removal kit without improvement.  No other concerns at this time.  HPI  History reviewed. No pertinent past medical history.  Patient Active Problem List   Diagnosis Date Noted   Tobacco abuse  06/30/2012   Right inguinal hernia 05/19/2012    Past Surgical History:  Procedure Laterality Date   HERNIA REPAIR  06/12/12   Right inguinal hernia       Home Medications    Prior to Admission medications   Medication Sig Start Date End Date Taking? Authorizing Provider  ofloxacin (FLOXIN) 0.3 % OTIC solution Place 10 drops into the right ear daily for 7 days. 07/08/23 07/15/23 Yes Radford Pax, NP  famotidine (PEPCID) 20 MG tablet Take 1 tablet (20 mg total) by mouth 2 (two) times daily. 02/24/20   Cathie Hoops, Amy V, PA-C  ondansetron (ZOFRAN ODT) 4 MG disintegrating tablet Take 1 tablet (4 mg total) by mouth every 8 (eight) hours as needed for nausea or vomiting. 02/24/20   Belinda Fisher, PA-C    Family History Family History  Problem Relation Age of Onset   Diabetes Mother     Social History Social History   Tobacco Use   Smoking status: Former    Current packs/day: 0.50    Types: Cigarettes   Smokeless tobacco: Never  Substance Use Topics   Alcohol use: Yes    Comment: social   Drug use: No     Allergies   Patient has no known allergies.   Review of Systems Review of Systems  HENT:  Positive for ear pain.      Physical Exam Triage Vital Signs ED Triage Vitals [07/08/23 1620]  Encounter Vitals Group     BP 126/80     Systolic BP Percentile       Diastolic BP Percentile      Pulse Rate 67     Resp 18     Temp 98.8 F (37.1 C)     Temp Source Oral     SpO2 97 %     Weight      Height      Head Circumference      Peak Flow      Pain Score 9     Pain Loc      Pain Education      Exclude from Growth Chart    No data found.  Updated Vital Signs BP 126/80 (BP Location: Left Arm)   Pulse 67   Temp 98.8 F (37.1 C) (Oral)   Resp 18   SpO2 97%   Visual Acuity Right Eye Distance:   Left Eye Distance:   Bilateral Distance:    Right Eye Near:   Left Eye Near:    Bilateral Near:     Physical Exam Vitals and nursing note reviewed.  Constitutional:      General: He is not in acute distress.    Appearance: Normal appearance. He is not ill-appearing.  HENT:     Head: Normocephalic and  atraumatic.     Right Ear: No drainage. There is impacted cerumen. No mastoid tenderness.     Ears:     Comments: Very mild swelling and erythema of the right external canal.  Cerumen impaction noted and TM is not visible. Eyes:     Pupils: Pupils are equal, round, and reactive to light.  Cardiovascular:     Rate and Rhythm: Normal rate.  Pulmonary:     Effort: Pulmonary effort is normal.  Skin:    General: Skin is warm and dry.  Neurological:     General: No focal deficit present.     Mental Status: He is alert and oriented to person, place, and time.  Psychiatric:        Mood and Affect: Mood normal.        Behavior: Behavior normal.      UC Treatments / Results  Labs (all labs ordered are listed, but only abnormal results are displayed) Labs Reviewed - No data to display  EKG   Radiology No results found.  Procedures Procedures (including critical care time)  Medications Ordered in UC Medications - No data to display  Initial Impression / Assessment and Plan / UC Course  I have reviewed the triage vital signs and the nursing notes.  Pertinent labs & imaging results that were available during my care of the  patient were reviewed by me and considered in my medical decision making (see chart for details).     Symptom irrigation of the right ear.  Minimal earwax removal and patient reporting irritation.  Irrigation stopped.  There is some mild erythema and swelling of the canal.  Will start ofloxacin antibiotic eardrops and instructed patient to not attempt any more home irrigation.  Keep water in the ear until treatment is complete.  PCP follow-up if symptoms do not improve.  ER precautions reviewed and patient verbalized understanding. Final Clinical Impressions(s) / UC Diagnoses   Final diagnoses:  Acute otitis externa of right ear, unspecified type  Impacted cerumen of right ear     Discharge Instructions      Start ofloxacin antibiotic eardrops as prescribed.  Keep water out of the ear until you are done with treatment.  Do not attempt any more additional earwax irrigations at home.  Once you complete treatment you may return to clinic for evaluation of further earwax removal if indicated.  Please follow-up with your PCP in 2 days for recheck.  Please go to the ER for any worsening symptoms.  I hope you feel better soon!     ED Prescriptions     Medication Sig Dispense Auth. Provider   ofloxacin (FLOXIN) 0.3 % OTIC solution Place 10 drops into the right ear daily for 7 days. 5 mL Radford Pax, NP      PDMP not reviewed this encounter.   Radford Pax, NP 07/08/23 [phone]
